# Patient Record
Sex: Male | Born: 1995 | Race: White | Hispanic: No | Marital: Single
Health system: Southern US, Community
[De-identification: ages and names within clinical notes are randomized; demographics above are authoritative.]

## PROBLEM LIST (undated history)

## (undated) DIAGNOSIS — R6250 Unspecified lack of expected normal physiological development in childhood: Secondary | ICD-10-CM

---
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
## 2014-01-14 IMAGING — CT CT ABD-PELV W/ CM
1 of 2 series · 15 of 32 positions shown, 19 images · non-contrast
Comparison: none

REASON FOR EXAM: (1) diffuse abdominal pain with guarding.  r/o appy; (2)
Diffuse abdominal pain
COMMENTS:

[Series 2: 3mm soft tissue · axial · 0.70mm/px · z∈[+443,+911]mm · 15 of 172 slices shown, 19 images]
[im 8/172  soft-tissue]
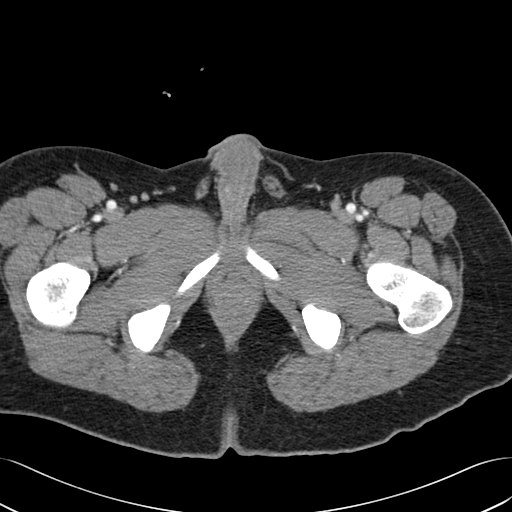
[im 8/172  bone]
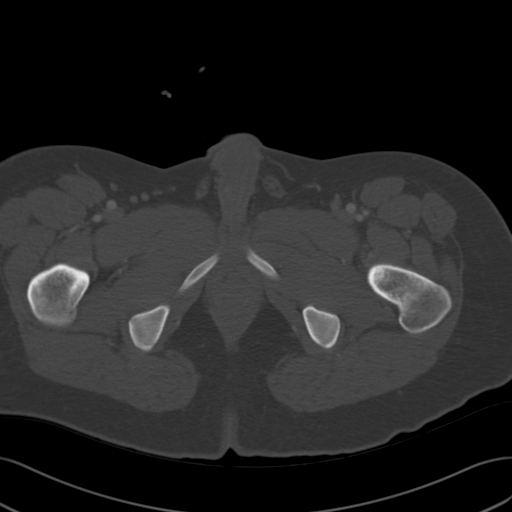
[im 22/172  soft-tissue]
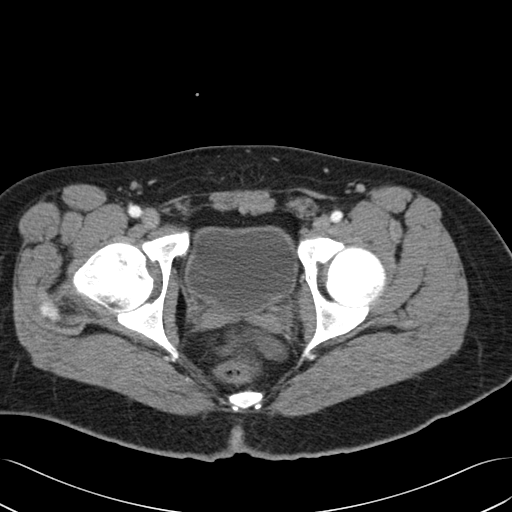
[im 36/172  soft-tissue]
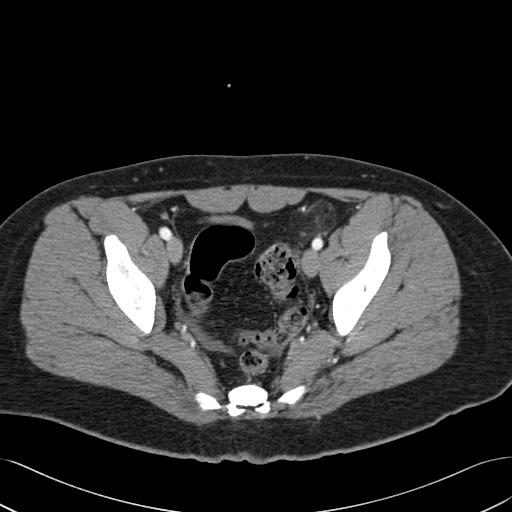
[im 50/172  soft-tissue]
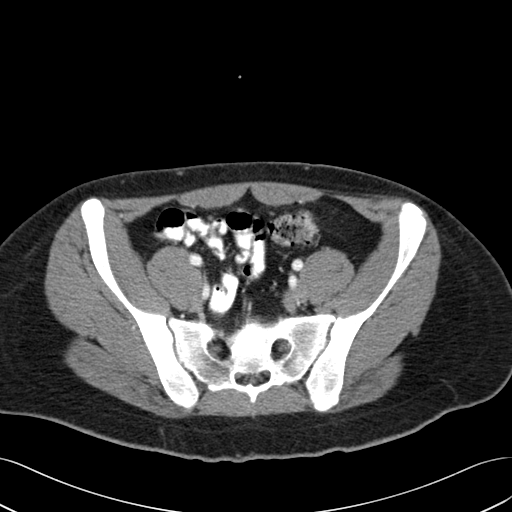
[im 58/172  soft-tissue]
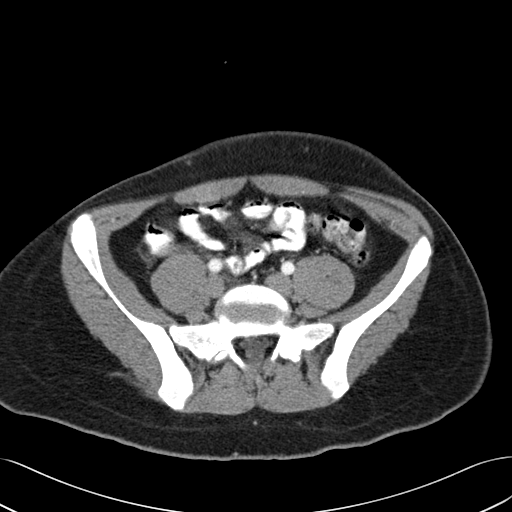
[im 72/172  soft-tissue]
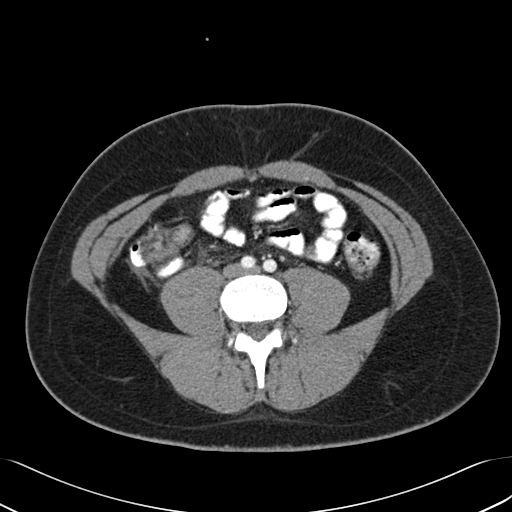
[im 86/172  soft-tissue]
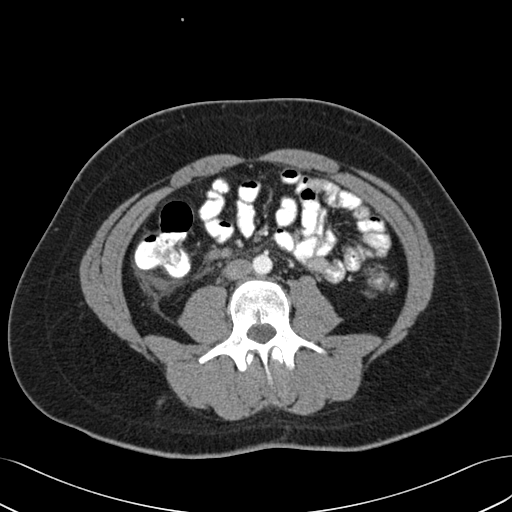
[im 100/172  soft-tissue]
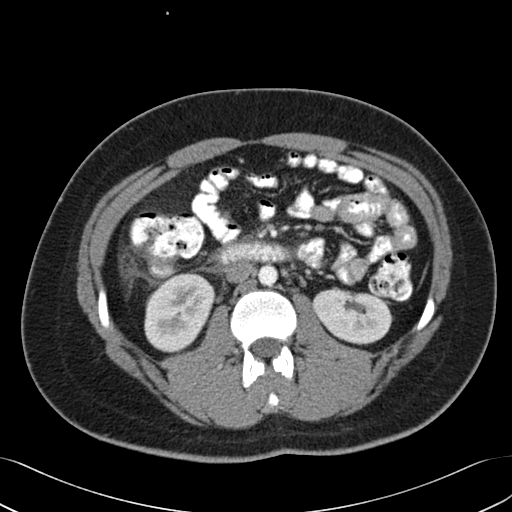
[im 115/172  soft-tissue]
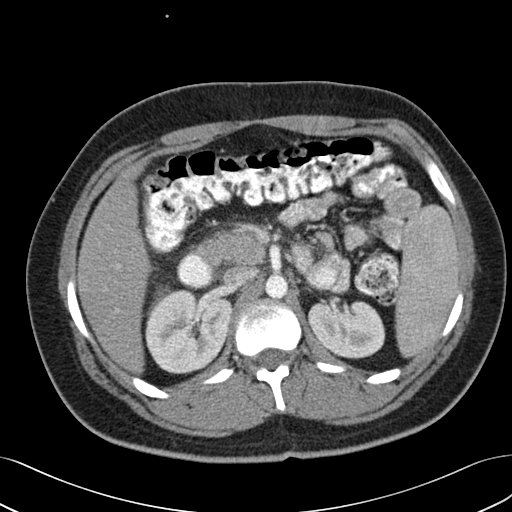
[im 115/172  bone]
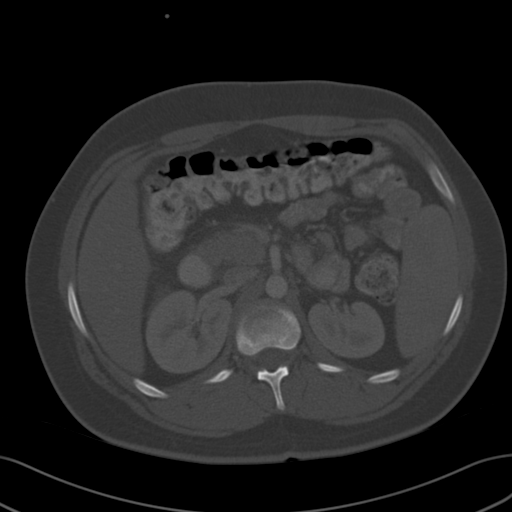
[im 122/172  soft-tissue]
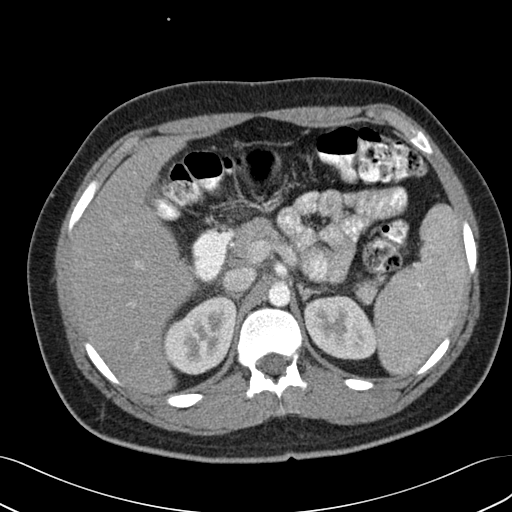
[im 136/172  soft-tissue]
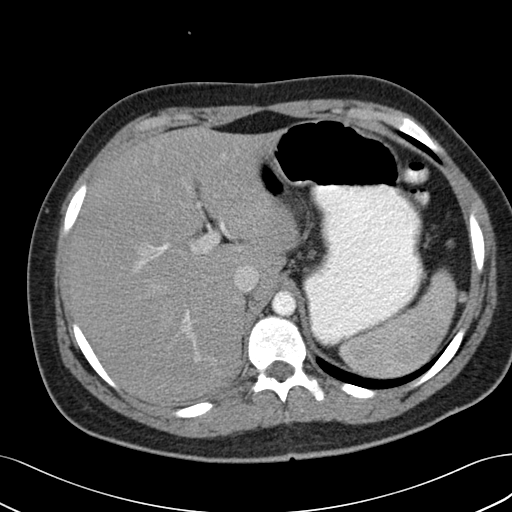
[im 143/172  lung]
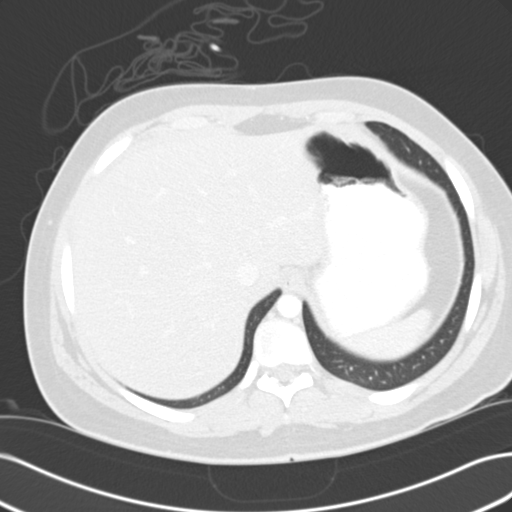
[im 150/172  soft-tissue]
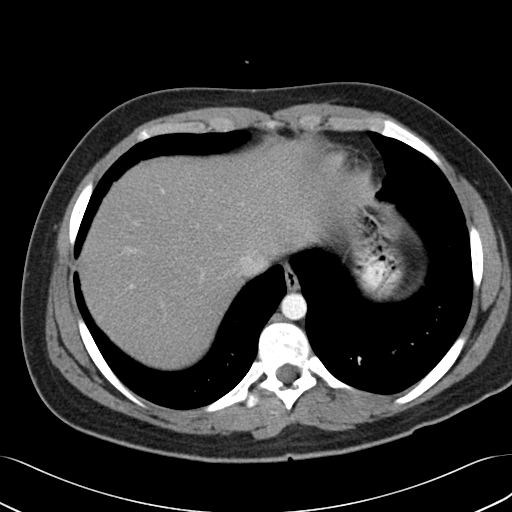
[im 150/172  lung]
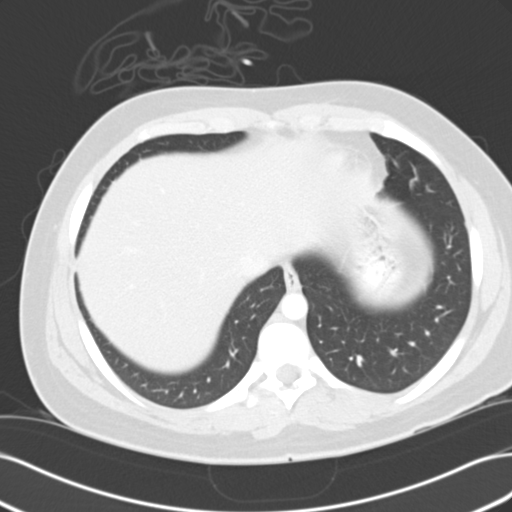
[im 157/172  lung]
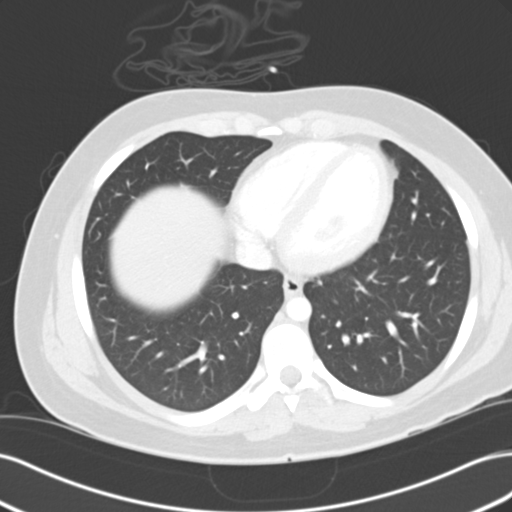
[im 164/172  soft-tissue]
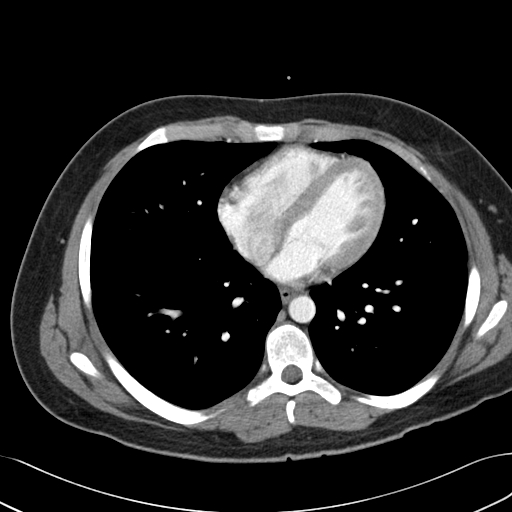
[im 164/172  lung]
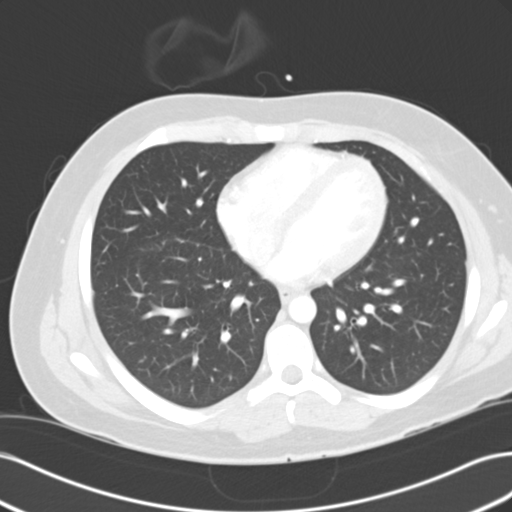

[15 of 32 positions shown; findings below may reference images not displayed]

PROCEDURE:     CT  - CT ABDOMEN / PELVIS  W  - May 03, 2012  [DATE]

RESULT:     CT of the abdomen and pelvis is performed with 100 mL of
Ksovue-9LL iodinated intravenous contrast and oral contrast with images
reconstructed at 3.0 mm slice thickness in the axial plane. The patient has
no previous exam for comparison.

Images through the base the lungs demonstrate grossly normal aeration.

There is an abnormal appearance of the terminal ileum, cecum and appendix.
There is inflammatory stranding in the right lower quadrant region with a
trace amount of fluid present. No free air is evident. Mildly prominent
lymph nodes are seen in the right mid abdominal region medial to the
ascending colon. There is no abscess formation or evidence of perforation.
There is no free fluid in the pelvis. There is a small fat filled left
inguinal hernia. The urinary bladder contains a small amount of fluid. The
remaining portions of the colon and small bowel appear unremarkable.

The liver, spleen, adrenal glands, aorta, gallbladder and kidneys appear
unremarkable. The bony structures appear within normal limits.
IMPRESSION: 1. Abnormal appearance of the appendix, terminal ileum and cecum. The
appendix is distended and has some surrounding inflammation. Differential
considerations are for acute appendicitis without perforation or abscess
formation or possibly inflammation of the terminal ileum and cecum from
enterocolitis with reactive inflammation in the appendix. The latter
scenario is felt to be less likely. Mildly enlarged lymph nodes are noted in
the right lower quadrant and right midabdomen. Surgical consultation is
recommended.

[REDACTED](*)

## 2014-03-30 IMAGING — US ABDOMEN ULTRASOUND LIMITED
1 series · 14 of 25 positions shown · non-contrast
Comparison: none

REASON FOR EXAM: elevated lipase
COMMENTS:   Body Site: GB and Fossa, CBD, Head of Pancreas

PROCEDURE:     US  - US ABDOMEN LIMITED SURVEY  - July 17, 2012  [DATE]
RESULT:     History: Elevated lipase.
Comparison Study: Prior CT of [DATE]

[Series 1: abdomen ultrasound limited · 0.28mm/px · 14 of 44 slices shown]
[im 1/44]
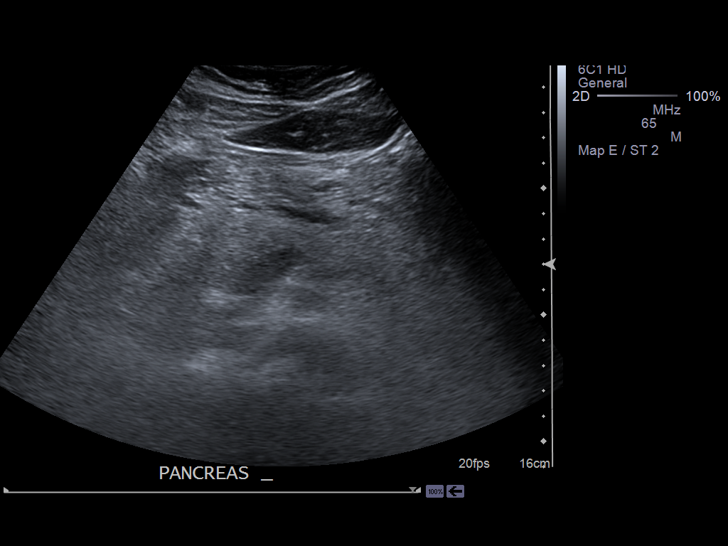
[im 4/44]
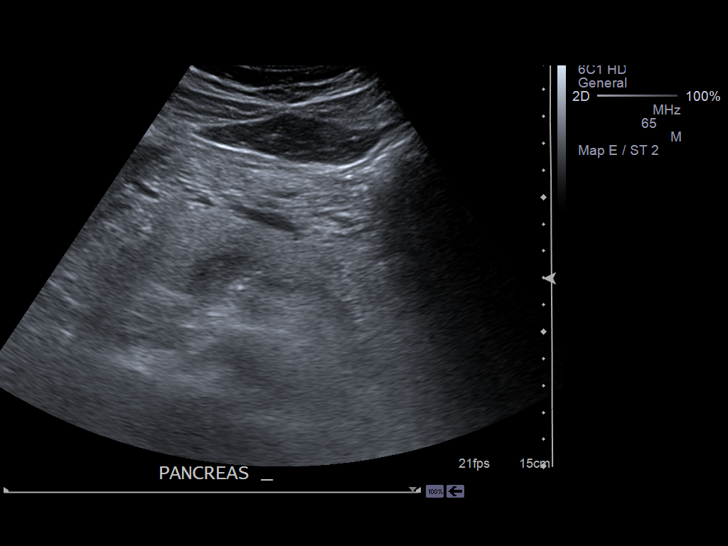
[im 8/44]
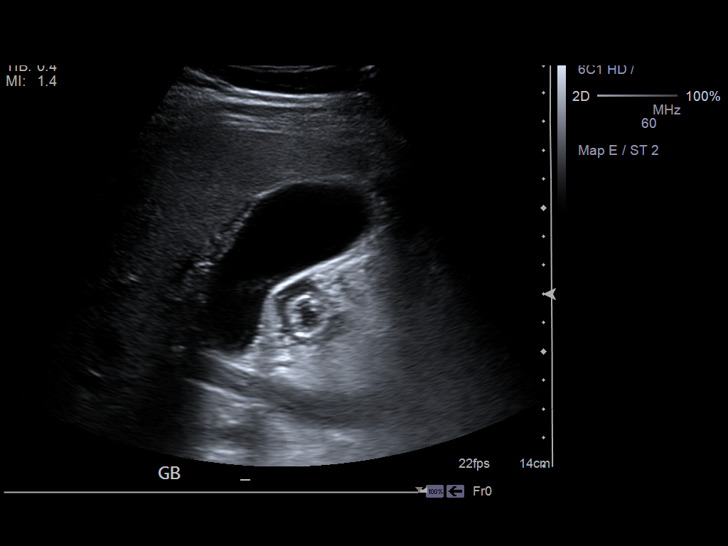
[im 11/44]
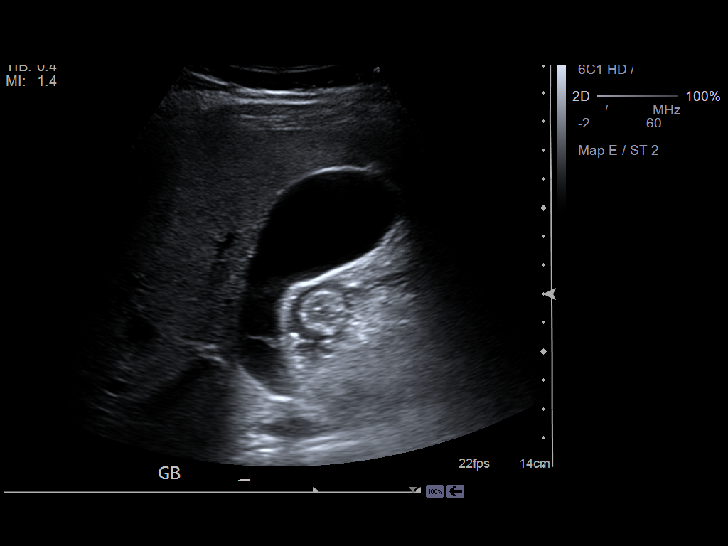
[im 15/44]
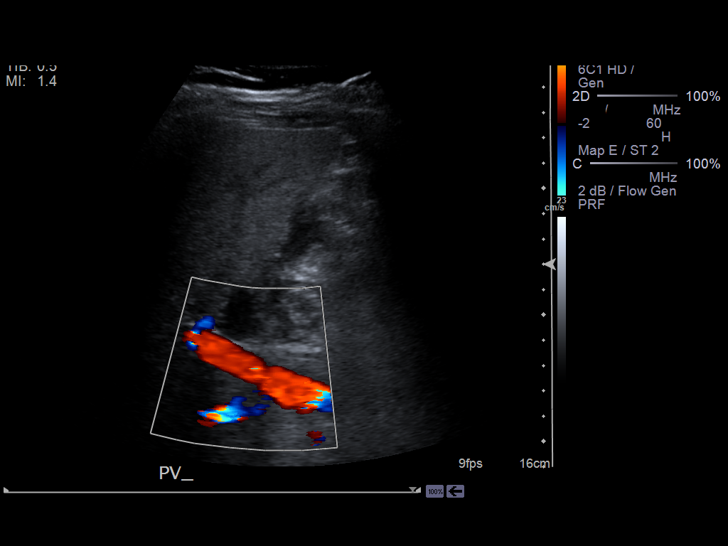
[im 17/44]
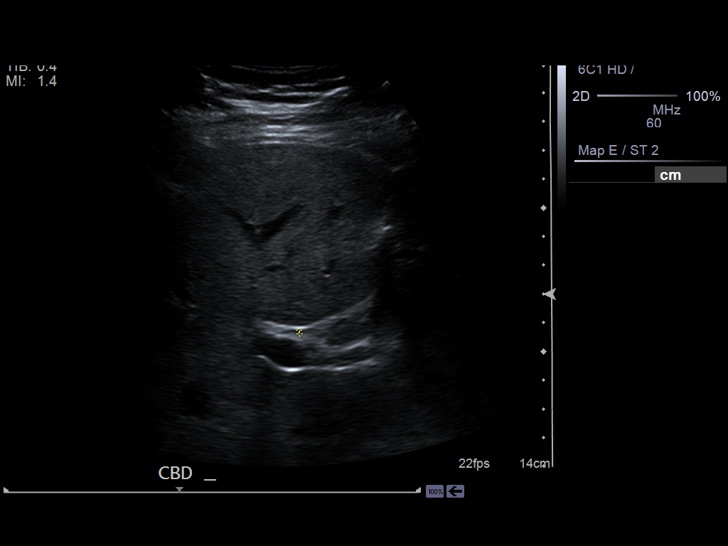
[im 20/44]
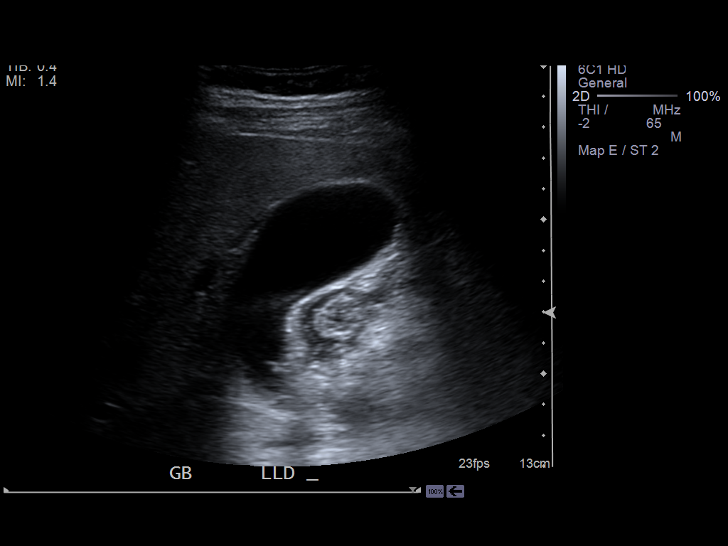
[im 24/44]
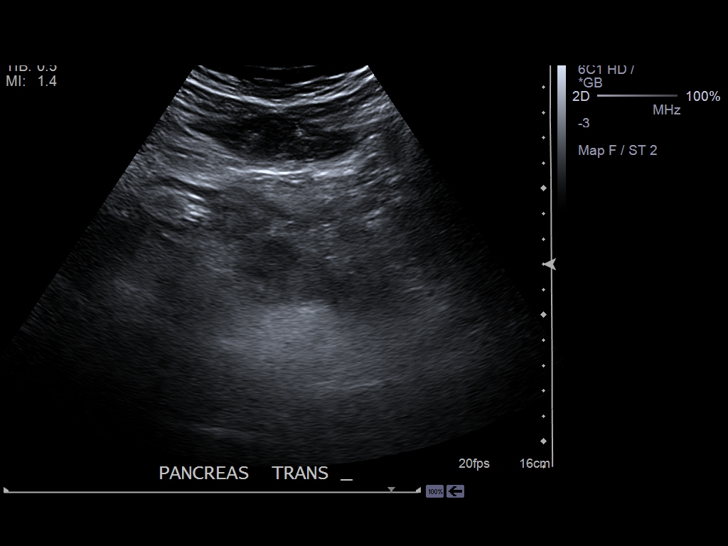
[im 27/44]
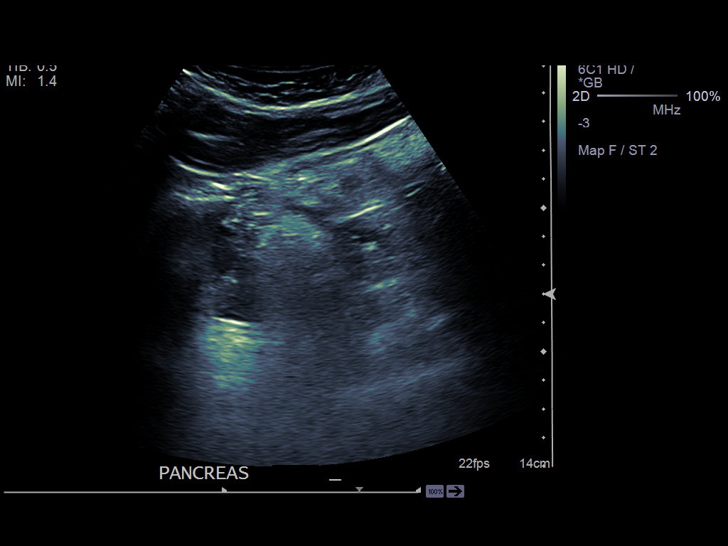
[im 29/44]
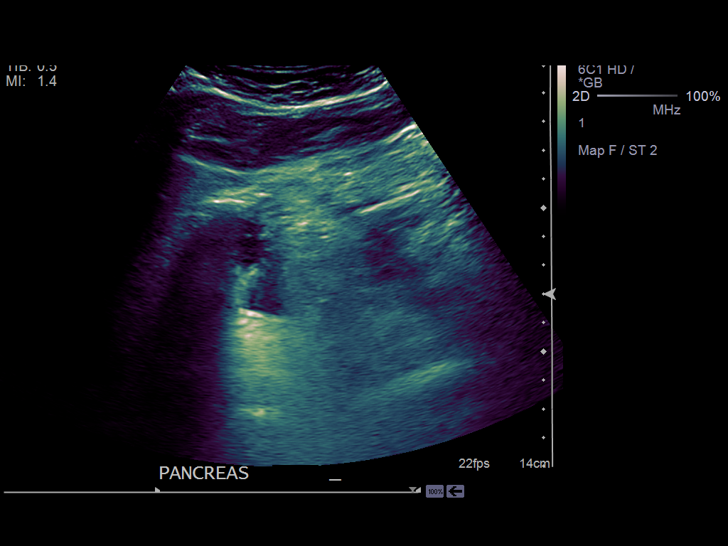
[im 33/44]
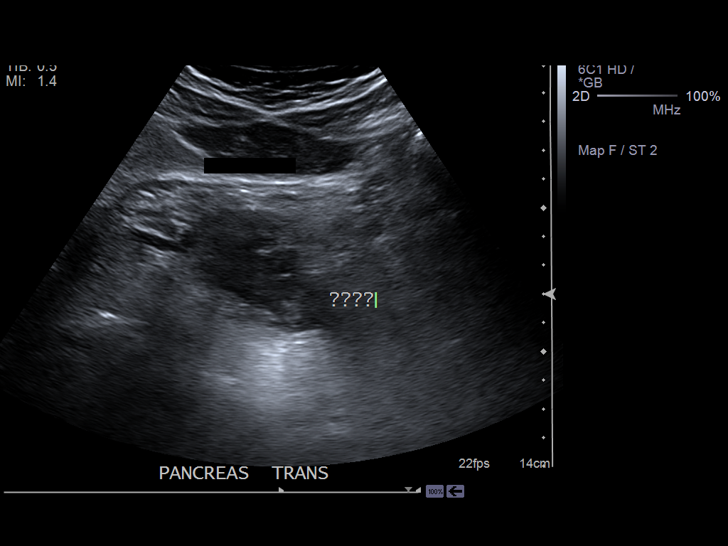
[im 36/44]
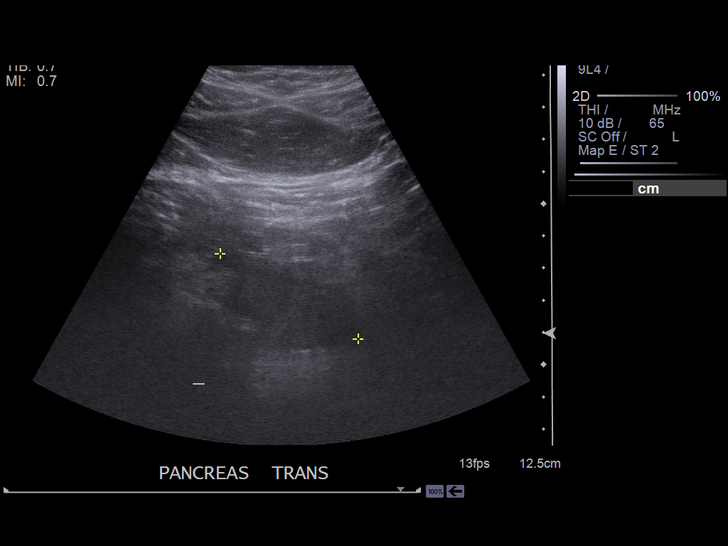
[im 40/44]
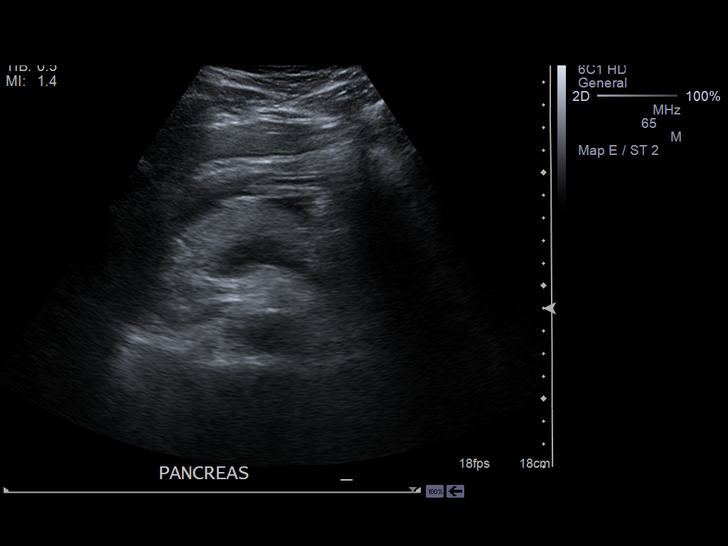
[im 44/44]
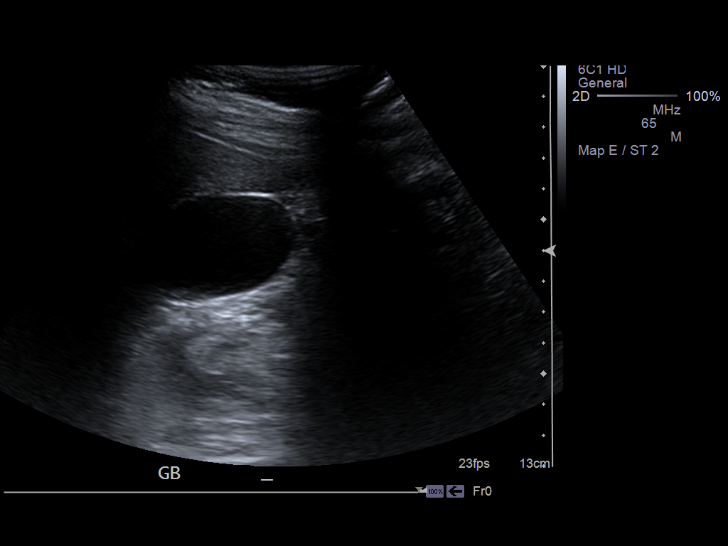

[14 of 25 positions shown; findings below may reference images not displayed]

FINDINGS: Standard ultrasound obtained. Gallbladder is normal. Negative
Murphy's sign. Gallbladder wall thickness 3.0 mm, the common bile duct
diameter 2.5 mm. This could be a phlegmon. This could be a tumor. There is a
complex 3.8 cm mass adjacent pancreatic tail.
IMPRESSION: Complex mass adjacent to the pancreatic tail. This could
represent pancreatic phlegmon or tumor.

## 2015-03-22 NOTE — Op Note (Signed)
PATIENT NAME:  [name], Frederick M MR#:  [id number] DATE OF BIRTH:  1996-06-25  DATE OF PROCEDURE:  05/03/2012  PREOPERATIVE DIAGNOSIS: Right lower quadrant abdominal pain.   POSTOPERATIVE DIAGNOSIS: Acute suppurative appendicitis.   PROCEDURE PERFORMED:  1. Diagnostic laparoscopy.  2. Laparoscopic appendectomy.   SURGEON: Raynald KempMark A Makalyn Lennox, MD   ANESTHESIA: General endotracheal.   FINDINGS:  1. Normal terminal ileum and cecum. 2. Early acute appendicitis.  3. Left inguinal hernia.  SPECIMENS: Appendix to pathology.    ESTIMATED BLOOD LOSS: Minimal.  DESCRIPTION OF PROCEDURE: With the patient in the supine position, general oral endotracheal anesthesia was induced. A Foley catheter was placed. The left arm was padded and tucked at his side. The abdomen was clipped of hair, prepped and draped utilizing ChloraPrep solution. Timeout was observed.   A 12 mm blunt Hassan trocar was placed through an infraumbilical transversely-oriented skin incision with stay sutures being passed through the fascia. Pneumoperitoneum was established. Diagnostic laparoscopy was then performed. In order to accomplish this, a 5 mm Bladeless trocar was placed in the right upper quadrant. A 5 mm Bladeless trocar with balloon occlusion was placed in the suprapubic midline. The small bowel was run and found to be unremarkable. The appendix was acutely inflamed. Photodocumentation was obtained. The liver and gallbladder appeared normal in appearance. There was a left inguinal hernia containing incarcerated omentum. This was undisturbed. The appendix was elevated towards the anterior abdominal wall. Filmy adhesions were taken down with sharp dissection laterally. A window was fashioned with blunt technique at the base of the appendix. The right upper quadrant 5 mm Bladeless trocar was upsized to a 12 mm Bladeless trocar for passage of the stapler. A single blue load of the endoscopic 35 mm stapler was used to achieve transection of  the base of the appendix. The mesoappendix was then likewise taken with a single fire of the white load stapler. Hemostasis was obtained with point cautery and one staple line on the bowel wall. The right lower quadrant was then irrigated with a total of 1 liter of lactated normal saline and aspirated dry. The specimen was captured in an EndoCatch device and retrieved easily. Pneumoperitoneum was established once again, and all fluid was aspirated. Ports were then removed under direct visualization. A total of 20 mL of 0.25% plain Marcaine was infiltrated along all skin and fascial incisions prior to closure. Hemostasis was obtained in subcutaneous tissues with point cautery. A 4-0 Vicryl was used to reapproximate the skin edges, followed by the application of benzoin, Steri-Strips, Telfa, and Tegaderm. The patient was then subsequently extubated and taken to the recovery room in stable and satisfactory condition by Anesthesia services.    ____________________________ Redge GainerMark A. Egbert GaribaldiBird, MD mab:cbb D: 05/03/2012 15:10:54 ET T: 05/03/2012 15:40:40 ET JOB#: [id number]  cc: Loraine LericheMark A. Egbert GaribaldiBird, MD, <Dictator> Raynald KempMARK A Chenoa Luddy MD ELECTRONICALLY SIGNED 05/04/2012 13:36

## 2015-03-22 NOTE — H&P (Signed)
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 accompanied by mother from which history is obtained.  In ER ct scan cscan concerning for acute appendicitis,  wbc normal but lipase found to be elevated, being repeated.    Past History see above   Past Med/Surgical Hx:  scizoaffective:   ODD:   adhd:   bipolar:   ALLERGIES:  Other -Explain in Comment Field: Anaphylaxis  Augmentin: Resp. Distress, Hives  Tape: Hives    Other Allergies none   HOME MEDICATIONS: Medication Status  lithium Active  trazadone Active  geodon Active  adderall Active  klonopin Active  multiple meds.  Active   Family and Social History:   Family History Non-Contributory    Place of Living Home   Review of Systems:   Subjective/Chief Complaint see above    Abdominal Pain Yes  see above.    Nausea/Vomiting No    Tolerating Diet No   Physical Exam:   GEN obese, disheveled    HEENT pale conjunctivae, PERRL    NECK supple    RESP normal resp effort  clear BS    CARD regular rate    ABD positive tenderness  no liver/spleen enlargement  no hernia  pos rovsings and focal rlq peritoneal signs    EXTR negative cyanosis/clubbing    NEURO cranial nerves intact    PSYCH A+O to time, place, person   Lab Results: Hepatic:  06-Jun-13 04:45    Bilirubin, Total 0.4   Alkaline Phosphatase 131   SGPT (ALT)  88 (12-78 NOTE: NEW REFERENCE RANGE 10/21/2011)   SGOT (AST)  90   Total Protein, Serum 7.8   Albumin, Serum 4.0  Routine Chem:  06-Jun-13 04:45    Glucose, Serum  108   BUN 12   Creatinine (comp) 0.66   Sodium, Serum 139   Potassium, Serum 4.1   Chloride, Serum 102   CO2, Serum  27   Calcium (Total), Serum 9.3   Osmolality (calc)  278   Anion Gap 10 (Result(s) reported on 03 May 2012 at 07:45AM.)   Lipase  2347 (Result(s) reported on 03 May 2012 at 07:51AM.)  Routine UA:  06-Jun-13 04:45    Color (UA) Yellow   Clarity (UA) Clear   Glucose (UA) Negative   Bilirubin (UA) Negative   Ketones (UA) Trace   Specific Gravity (UA) 1.020   Blood (UA) Negative   pH (UA) 6.0   Protein (UA) Negative   Nitrite (UA) Negative   Leukocyte Esterase (UA) Negative (Result(s) reported on 03 May 2012 at 08:48AM.)   RBC (UA) 1 /HPF   WBC (UA) 1 /HPF   Bacteria (UA) NONE SEEN   Epithelial Cells (UA) NONE SEEN   Mucous (UA) PRESENT (Result(s) reported on 03 May 2012 at 08:48AM.)  Routine Hem:  06-Jun-13 04:45    WBC (CBC) 7.7   RBC (CBC) 4.85   Hemoglobin (CBC) 15.7   Hematocrit (CBC) 45.7   Platelet Count (CBC) 183 (Result(s) reported on 03 May 2012 at 07:23AM.)   MCV 94   MCH 32.4   MCHC 34.3   RDW 12.6   Radiology Results: CT:    06-Jun-13 08:19, CT Abdomen and Pelvis With Contrast   CT Abdomen and Pelvis With Contrast  REASON FOR EXAM:    (1) diffuse abdominal pain with guarding.  r/o appy;   (2) Diffuse abdominal pain  COMMENTS:       PROCEDURE: CT  - CT ABDOMEN / PELVIS  W  - May 03 2012  8:19AM     RESULT: CT of the abdomen and pelvis is performed with 100 mL of  Isovue-300 iodinated intravenous contrast and oral contrast with images   reconstructed at 3.0 mm slice thickness in the axial plane. The patient   has no previous exam for comparison.    Images through the base the lungs demonstrate grossly normalaeration.    There is an abnormal appearance of the terminal ileum, cecum and   appendix. There is inflammatory stranding in the right lower quadrant     region with a trace amount of fluid present. No free air is evident.   Mildly prominent lymph nodes are seen in the right mid abdominal region   medial to the ascending colon. There is no abscess formation or evidence   of perforation. There is no  free fluid in the pelvis. There is a small   fat filled left inguinal hernia. The urinary bladder contains a small   amount of fluid. The remaining portions of the colon and small bowel   appear unremarkable.    The liver, spleen, adrenal glands, aorta, gallbladder and kidneys appear   unremarkable. The bony structures appear within normal limits.    IMPRESSION:   1. Abnormal appearance of the appendix, terminal ileum and cecum. The   appendix is distended and has some surrounding inflammation. Differential   considerations are for acute appendicitis without perforation or abscess     formationor possibly inflammation of the terminal ileum and cecum from   enterocolitis with reactive inflammation in the appendix. The latter   scenario is felt to be less likely. Mildly enlarged lymph nodes are noted   in the right lower quadrant and right midabdomen. Surgical consultation   is recommended.    Dictation Site: 1(*)          Verified By: Elveria RoyalsGEOFFREY H. BROWNE, M.D., MD     Assessment/Admission Diagnosis 19 y/o male with clinical exam and hx consistent with acute appendicitis. elevated lipase not consistent with clinical hx and ct scan scan findings.    Plan npo, invanz, repeat lipase. diagnostic laparoscopy, appendectomy.   Electronic Signatures: Natale LayBird, Keaira Whitehurst (MD)  (Signed 06-Jun-13 10:38)  Authored: CHIEF COMPLAINT and HISTORY, PAST MEDICAL/SURGIAL HISTORY, ALLERGIES, Other Allergies, HOME MEDICATIONS, FAMILY AND SOCIAL HISTORY, REVIEW OF SYSTEMS, PHYSICAL EXAM, LABS, Radiology, ASSESSMENT AND PLAN   Last Updated: 06-Jun-13 10:38 by Natale LayBird, Amilah Greenspan (MD)

## 2015-04-25 ENCOUNTER — Encounter: Payer: Self-pay | Admitting: Emergency Medicine

## 2015-04-25 ENCOUNTER — Emergency Department
Admission: EM | Admit: 2015-04-25 | Discharge: 2015-04-25 | Disposition: A | Discharge status: Home / Self Care | Payer: Medicare Other | Attending: Emergency Medicine | Admitting: Emergency Medicine

## 2015-04-25 DIAGNOSIS — J029 Acute pharyngitis, unspecified: Secondary | ICD-10-CM | POA: Diagnosis present

## 2015-04-25 DIAGNOSIS — B9789 Other viral agents as the cause of diseases classified elsewhere: Secondary | ICD-10-CM

## 2015-04-25 DIAGNOSIS — J40 Bronchitis, not specified as acute or chronic: Secondary | ICD-10-CM | POA: Diagnosis not present

## 2015-04-25 DIAGNOSIS — H73892 Other specified disorders of tympanic membrane, left ear: Secondary | ICD-10-CM | POA: Insufficient documentation

## 2015-04-25 DIAGNOSIS — J988 Other specified respiratory disorders: Secondary | ICD-10-CM

## 2015-04-25 DIAGNOSIS — J069 Acute upper respiratory infection, unspecified: Secondary | ICD-10-CM | POA: Insufficient documentation

## 2015-04-25 HISTORY — DX: Unspecified lack of expected normal physiological development in childhood: R62.50

## 2015-04-25 MED ORDER — ALBUTEROL SULFATE HFA 108 (90 BASE) MCG/ACT IN AERS: 2.0000 | INHALATION_SPRAY | Freq: Four times a day (QID) | RESPIRATORY_TRACT | Status: DC | PRN

## 2015-04-25 MED ORDER — IPRATROPIUM-ALBUTEROL 0.5-2.5 (3) MG/3ML IN SOLN
RESPIRATORY_TRACT | Status: AC
  Filled 2015-04-25: qty 3

## 2015-04-25 MED ORDER — IPRATROPIUM-ALBUTEROL 0.5-2.5 (3) MG/3ML IN SOLN
3.0000 mL | Freq: Once | RESPIRATORY_TRACT | Status: AC
  Administered 2015-04-25: 3 mL via RESPIRATORY_TRACT

## 2015-04-25 MED ORDER — PREDNISONE 20 MG PO TABS: 40.0000 mg | ORAL_TABLET | Freq: Every day | ORAL | Status: DC

## 2015-04-25 NOTE — ED Provider Notes (Signed)
CSN: [id number]     Arrival date & time 04/25/15  1540 History   First MD Initiated Contact with Patient 04/25/15 1651     Chief Complaint  Patient presents with  . Sore Throat    sore throat and earaches since yesterday     (Consider location/radiation/quality/duration/timing/severity/associated sxs/prior Treatment) HPI  19 year old male presents with a two-week history of cough, congestion. One week ago he had significant sinus pressure, was treated with Tobi Bastos biotic incompleted antibody. He did improve up until the last 2-3 days he has developed ear pain, pressure, coughing. Patient has had a mild sore throat. He was treated with over-the-counter cold medication, this did relieve his symptoms significantly. Currently patient is not having any ear pain, sore throat, chest pain, shortness of breath. He has not had any fevers. He is tolerating by mouth well.   Past Medical History  Diagnosis Date  . Developmental delay    No past surgical history on file. No family history on file. History  Substance Use Topics  . Smoking status: Not on file  . Smokeless tobacco: Not on file  . Alcohol Use: Not on file    Review of Systems  Constitutional: Negative.  Negative for fever, chills, activity change and appetite change.  HENT: Positive for congestion, ear pain, rhinorrhea and sore throat. Negative for mouth sores, sinus pressure and trouble swallowing.   Eyes: Negative for photophobia, pain and discharge.  Respiratory: Negative for cough, chest tightness and shortness of breath.   Cardiovascular: Negative for chest pain and leg swelling.  Gastrointestinal: Negative for nausea, vomiting, abdominal pain, diarrhea and abdominal distention.  Genitourinary: Negative for dysuria and difficulty urinating.  Musculoskeletal: Negative for back pain, arthralgias and gait problem.  Skin: Negative for color change and rash.  Neurological: Negative for dizziness and headaches.  Hematological:  Negative for adenopathy.  Psychiatric/Behavioral: Negative for behavioral problems and agitation.      Allergies  Augmentin  Home Medications   Prior to Admission medications   Medication Sig Start Date End Date Taking? Authorizing Provider  albuterol (PROVENTIL HFA;VENTOLIN HFA) 108 (90 BASE) MCG/ACT inhaler Inhale 2 puffs into the lungs every 6 (six) hours as needed for wheezing or shortness of breath. 04/25/15   Evon Slack, PA-C  predniSONE (DELTASONE) 20 MG tablet Take 2 tablets (40 mg total) by mouth daily. 04/25/15   Evon Slack, PA-C   BP 145/80 mmHg  Pulse 96  Temp(Src) 98.2 F (36.8 C) (Oral)  Resp 18  Ht  (1.753 m)  Wt 250 lb (113.399 kg)  BMI 36.90 kg/m2  SpO2 97% Physical Exam  Constitutional: He is oriented to person, place, and time. He appears well-developed and well-nourished.  HENT:  Head: Normocephalic and atraumatic.  Right Ear: Hearing, tympanic membrane and ear canal normal. No drainage or tenderness. No foreign bodies. No mastoid tenderness. Tympanic membrane is not bulging.  Left Ear: Hearing, external ear and ear canal normal. No drainage or tenderness. No foreign bodies. No mastoid tenderness. Tympanic membrane is bulging.  Nose: No rhinorrhea.  Mouth/Throat: No oral lesions. No trismus in the jaw. Normal dentition. No uvula swelling. No oropharyngeal exudate, posterior oropharyngeal edema, posterior oropharyngeal erythema or tonsillar abscesses.  Eyes: Conjunctivae and EOM are normal. Pupils are equal, round, and reactive to light.  Neck: Normal range of motion. Neck supple.  Cardiovascular: Normal rate, regular rhythm, normal heart sounds and intact distal pulses.   Pulmonary/Chest: Effort normal. No respiratory distress. He has wheezes (slight expiratory).  He has no rales. He exhibits no tenderness.  Abdominal: Soft. Bowel sounds are normal.  Musculoskeletal: Normal range of motion. He exhibits no edema or tenderness.  Lymphadenopathy:     He has no cervical adenopathy.  Neurological: He is alert and oriented to person, place, and time. No cranial nerve deficit.  Skin: Skin is warm and dry.  Psychiatric: He has a normal mood and affect. His behavior is normal. Judgment and thought content normal.    ED Course  Procedures (including critical care time) Patient was given a DuoNeb treatment 1. This did improve his wheezing. Patient had no wheezing at discharge. Labs Review Labs Reviewed - No data to display Point care rapid strep test negative for strep, sent for culture  Imaging Review No results found.   EKG Interpretation None      MDM   Final diagnoses:  Viral respiratory illness  Bronchitis    Patient was given a prescription for prednisone 40 mg 1 tab by mouth daily 5 days. He was also given a prescription for albuterol inhaler. Continue with over-the-counter cough and cold medicine. Follow-up if no relief in 5-7 days. Return to the ER for any fevers or difficulty swallowing shortness of breath.    Evon Slackhomas C Gaines, PA-C 04/25/15 1739  Sharman CheekPhillip Stafford, MD 04/25/15 [phone]

## 2015-04-25 NOTE — Discharge Instructions (Signed)
Upper Respiratory Infection, Adult An upper respiratory infection (URI) is also sometimes known as the common cold. The upper respiratory tract includes the nose, sinuses, throat, trachea, and bronchi. Bronchi are the airways leading to the lungs. Most people improve within 1 week, but symptoms can last up to 2 weeks. A residual cough may last even longer.  CAUSES Many different viruses can infect the tissues lining the upper respiratory tract. The tissues become irritated and inflamed and often become very moist. Mucus production is also common. A cold is contagious. You can easily spread the virus to others by oral contact. This includes kissing, sharing a glass, coughing, or sneezing. Touching your mouth or nose and then touching a surface, which is then touched by another person, can also spread the virus. SYMPTOMS  Symptoms typically develop 1 to 3 days after you come in contact with a cold virus. Symptoms vary from person to person. They may include: 1. Runny nose. 2. Sneezing. 3. Nasal congestion. 4. Sinus irritation. 5. Sore throat. 6. Loss of voice (laryngitis). 7. Cough. 8. Fatigue. 9. Muscle aches. 10. Loss of appetite. 11. Headache. 12. Low-grade fever. DIAGNOSIS  You might diagnose your own cold based on familiar symptoms, since most people get a cold 2 to 3 times a year. Your caregiver can confirm this based on your exam. Most importantly, your caregiver can check that your symptoms are not due to another disease such as strep throat, sinusitis, pneumonia, asthma, or epiglottitis. Blood tests, throat tests, and X-rays are not necessary to diagnose a common cold, but they may sometimes be helpful in excluding other more serious diseases. Your caregiver will decide if any further tests are required. RISKS AND COMPLICATIONS  You may be at risk for a more severe case of the common cold if you smoke cigarettes, have chronic heart disease (such as heart failure) or lung disease (such as  asthma), or if you have a weakened immune system. The very young and very old are also at risk for more serious infections. Bacterial sinusitis, middle ear infections, and bacterial pneumonia can complicate the common cold. The common cold can worsen asthma and chronic obstructive pulmonary disease (COPD). Sometimes, these complications can require emergency medical care and may be life-threatening. PREVENTION  The best way to protect against getting a cold is to practice good hygiene. Avoid oral or hand contact with people with cold symptoms. Wash your hands often if contact occurs. There is no clear evidence that vitamin C, vitamin E, echinacea, or exercise reduces the chance of developing a cold. However, it is always recommended to get plenty of rest and practice good nutrition. TREATMENT  Treatment is directed at relieving symptoms. There is no cure. Antibiotics are not effective, because the infection is caused by a virus, not by bacteria. Treatment may include:  Increased fluid intake. Sports drinks offer valuable electrolytes, sugars, and fluids.  Breathing heated mist or steam (vaporizer or shower).  Eating chicken soup or other clear broths, and maintaining good nutrition.  Getting plenty of rest.  Using gargles or lozenges for comfort.  Controlling fevers with ibuprofen or acetaminophen as directed by your caregiver.  Increasing usage of your inhaler if you have asthma. Zinc gel and zinc lozenges, taken in the first 24 hours of the common cold, can shorten the duration and lessen the severity of symptoms. Pain medicines may help with fever, muscle aches, and throat pain. A variety of non-prescription medicines are available to treat congestion and runny nose. Your caregiver  can make recommendations and may suggest nasal or lung inhalers for other symptoms.  HOME CARE INSTRUCTIONS   Only take over-the-counter or prescription medicines for pain, discomfort, or fever as directed by your  caregiver.  Use a warm mist humidifier or inhale steam from a shower to increase air moisture. This may keep secretions moist and make it easier to breathe.  Drink enough water and fluids to keep your urine clear or pale yellow.  Rest as needed.  Return to work when your temperature has returned to normal or as your caregiver advises. You may need to stay home longer to avoid infecting others. You can also use a face mask and careful hand washing to prevent spread of the virus. SEEK MEDICAL CARE IF:   After the first few days, you feel you are getting worse rather than better.  You need your caregiver's advice about medicines to control symptoms.  You develop chills, worsening shortness of breath, or brown or red sputum. These may be signs of pneumonia.  You develop yellow or brown nasal discharge or pain in the face, especially when you bend forward. These may be signs of sinusitis.  You develop a fever, swollen neck glands, pain with swallowing, or white areas in the back of your throat. These may be signs of strep throat. SEEK IMMEDIATE MEDICAL CARE IF:   You have a fever.  You develop severe or persistent headache, ear pain, sinus pain, or chest pain.  You develop wheezing, a prolonged cough, cough up blood, or have a change in your usual mucus (if you have chronic lung disease).  You develop sore muscles or a stiff neck. Document Released: 05/10/2001 Document Revised: 02/06/2012 Document Reviewed: 02/19/2014 [facility] Patient Information 2015 [place], [place]. This information is not intended to replace advice given to you by your health care provider. Make sure you discuss any questions you have with your health care provider.  Viral Infections A viral infection can be caused by different types of viruses.Most viral infections are not serious and resolve on their own. However, some infections may cause severe symptoms and may lead to further complications. SYMPTOMS Viruses can  frequently cause: 13. Minor sore throat. 14. Aches and pains. 15. Headaches. 16. Runny nose. 17. Different types of rashes. 18. Watery eyes. 19. Tiredness. 20. Cough. 21. Loss of appetite. 22. Gastrointestinal infections, resulting in nausea, vomiting, and diarrhea. These symptoms do not respond to antibiotics because the infection is not caused by bacteria. However, you might catch a bacterial infection following the viral infection. This is sometimes called a "superinfection." Symptoms of such a bacterial infection may include:  Worsening sore throat with pus and difficulty swallowing.  Swollen neck glands.  Chills and a high or persistent fever.  Severe headache.  Tenderness over the sinuses.  Persistent overall ill feeling (malaise), muscle aches, and tiredness (fatigue).  Persistent cough.  Yellow, green, or brown mucus production with coughing. HOME CARE INSTRUCTIONS   Only take over-the-counter or prescription medicines for pain, discomfort, diarrhea, or fever as directed by your caregiver.  Drink enough water and fluids to keep your urine clear or pale yellow. Sports drinks can provide valuable electrolytes, sugars, and hydration.  Get plenty of rest and maintain proper nutrition. Soups and broths with crackers or rice are fine. SEEK IMMEDIATE MEDICAL CARE IF:   You have severe headaches, shortness of breath, chest pain, neck pain, or an unusual rash.  You have uncontrolled vomiting, diarrhea, or you are unable to keep down fluids.  You or your child has an oral temperature above 102 F (38.9 C), not controlled by medicine.  Your baby is older than 3 months with a rectal temperature of 102 F (38.9 C) or higher.  Your baby is 61 months old or younger with a rectal temperature of 100.4 F (38 C) or higher. MAKE SURE YOU:   Understand these instructions.  Will watch your condition.  Will get help right away if you are not doing well or get worse. Document  Released: 08/24/2005 Document Revised: 02/06/2012 Document Reviewed: 03/21/2011 [facility] Patient Information 2015 [place], [place]. This information is not intended to replace advice given to you by your health care provider. Make sure you discuss any questions you have with your health care provider.  Metered Dose Inhaler with Spacer Inhaled medicines are the basis of treatment of asthma and other breathing problems. Inhaled medicine can only be effective if used properly. Good technique assures that the medicine reaches the lungs. Your health care provider has asked you to use a spacer with your inhaler to help you take the medicine more effectively. A spacer is a plastic tube with a mouthpiece on one end and an opening that connects to the inhaler on the other end. Metered dose inhalers (MDIs) are used to deliver a variety of inhaled medicines. These include quick relief or rescue medicines (such as bronchodilators) and controller medicines (such as corticosteroids). The medicine is delivered by pushing down on a metal canister to release a set amount of spray. If you are using different kinds of inhalers, use your quick relief medicine to open the airways 10-15 minutes before using a steroid if instructed to do so by your health care provider. If you are unsure which inhalers to use and the order of using them, ask your health care provider, nurse, or respiratory therapist. HOW TO USE THE INHALER WITH A SPACER 23. Remove cap from inhaler. 24. If you are using the inhaler for the first time, you will need to prime it. Shake the inhaler for 5 seconds and release four puffs into the air, away from your face. Ask your health care provider or pharmacist if you have questions about priming your inhaler. 25. Shake inhaler for 5 seconds before each breath in (inhalation). 26. Place the open end of the spacer onto the mouthpiece of the inhaler. 27. Position the inhaler so that the top of the canister faces up  and the spacer mouthpiece faces you. 28. Put your index finger on the top of the medicine canister. Your thumb supports the bottom of the inhaler and the spacer. 29. Breathe out (exhale) normally and as completely as possible. 30. Immediately after exhaling, place the spacer between your teeth and into your mouth. Close your mouth tightly around the spacer. 31. Press the canister down with the index finger to release the medicine. 32. At the same time as the canister is pressed, inhale deeply and slowly until the lungs are completely filled. This should take 4-6 seconds. Keep your tongue down and out of the way. 33. Hold the medicine in your lungs for 5-10 seconds (10 seconds is best). This helps the medicine get into the small airways of your lungs. Exhale. 34. Repeat inhaling deeply through the spacer mouthpiece. Again hold that breath for up to 10 seconds (10 seconds is best). Exhale slowly. If it is difficult to take this second deep breath through the spacer, breathe normally several times through the spacer. Remove the spacer from your mouth. 35.  Wait at least 15-30 seconds between puffs. Continue with the above steps until you have taken the number of puffs your health care provider has ordered. Do not use the inhaler more than your health care provider directs you to. 36. Remove spacer from the inhaler and place cap on inhaler. 37. Follow the directions from your health care provider or the inhaler insert for cleaning the inhaler and spacer. If you are using a steroid inhaler, rinse your mouth with water after your last puff, gargle, and spit out the water. Do not swallow the water. AVOID:  Inhaling before or after starting the spray of medicine. It takes practice to coordinate your breathing with triggering the spray.  Inhaling through the nose (rather than the mouth) when triggering the spray. HOW TO DETERMINE IF YOUR INHALER IS FULL OR NEARLY EMPTY You cannot know when an inhaler is  empty by shaking it. A few inhalers are now being made with dose counters. Ask your health care provider for a prescription that has a dose counter if you feel you need that extra help. If your inhaler does not have a counter, ask your health care provider to help you determine the date you need to refill your inhaler. Write the refill date on a calendar or your inhaler canister. Refill your inhaler 7-10 days before it runs out. Be sure to keep an adequate supply of medicine. This includes making sure it is not expired, and you have a spare inhaler.  SEEK MEDICAL CARE IF:   Symptoms are only partially relieved with your inhaler.  You are having trouble using your inhaler.  You experience some increase in phlegm. SEEK IMMEDIATE MEDICAL CARE IF:   You feel little or no relief with your inhalers. You are still wheezing and are feeling shortness of breath or tightness in your chest or both.  You have dizziness, headaches, or fast heart rate.  You have chills, fever, or night sweats.  There is a noticeable increase in phlegm production, or there is blood in the phlegm. Document Released: 11/14/2005 Document Revised: 03/31/2014 Document Reviewed: 05/02/2013 [facility] Patient Information 2015 [place], [place]. This information is not intended to replace advice given to you by your health care provider. Make sure you discuss any questions you have with your health care provider.

## 2015-04-28 LAB — CULTURE, GROUP A STREP (THRC)

## 2023-11-25 ENCOUNTER — Other Ambulatory Visit: Payer: Self-pay

## 2023-11-25 ENCOUNTER — Emergency Department
Admission: EM | Admit: 2023-11-25 | Discharge: 2023-11-26 | Disposition: A | Discharge status: Home / Self Care | Payer: 59 | Attending: Emergency Medicine | Admitting: Emergency Medicine

## 2023-11-25 DIAGNOSIS — F88 Other disorders of psychological development: Secondary | ICD-10-CM | POA: Insufficient documentation

## 2023-11-25 DIAGNOSIS — R6250 Unspecified lack of expected normal physiological development in childhood: Secondary | ICD-10-CM | POA: Diagnosis present

## 2023-11-25 DIAGNOSIS — F319 Bipolar disorder, unspecified: Secondary | ICD-10-CM | POA: Diagnosis present

## 2023-11-25 DIAGNOSIS — R451 Restlessness and agitation: Secondary | ICD-10-CM | POA: Diagnosis not present

## 2023-11-25 DIAGNOSIS — Z79899 Other long term (current) drug therapy: Secondary | ICD-10-CM | POA: Diagnosis not present

## 2023-11-25 LAB — CBC
HCT: 46.3 % (ref 39.0–52.0)
Hemoglobin: 16.3 g/dL (ref 13.0–17.0)
MCH: 30.7 pg (ref 26.0–34.0)
MCHC: 35.2 g/dL (ref 30.0–36.0)
MCV: 87.2 fL (ref 80.0–100.0)
Platelets: 240 10*3/uL (ref 150–400)
RBC: 5.31 MIL/uL (ref 4.22–5.81)
RDW: 12.5 % (ref 11.5–15.5)
WBC: 7.8 10*3/uL (ref 4.0–10.5)
nRBC: 0 % (ref 0.0–0.2)

## 2023-11-25 LAB — COMPREHENSIVE METABOLIC PANEL
ALT: 39 U/L (ref 0–44)
AST: 34 U/L (ref 15–41)
Albumin: 4.1 g/dL (ref 3.5–5.0)
Alkaline Phosphatase: 116 U/L (ref 38–126)
Anion gap: 11 (ref 5–15)
BUN: 16 mg/dL (ref 6–20)
CO2: 28 mmol/L (ref 22–32)
Calcium: 9 mg/dL (ref 8.9–10.3)
Chloride: 101 mmol/L (ref 98–111)
Creatinine, Ser: 0.99 mg/dL (ref 0.61–1.24)
GFR, Estimated: >60 mL/min (ref >60)
Glucose, Bld: 96 mg/dL (ref 70–99)
Potassium: 4.1 mmol/L (ref 3.5–5.1)
Sodium: 140 mmol/L (ref 135–145)
Total Bilirubin: 0.5 mg/dL (ref <1.2)
Total Protein: 7.1 g/dL (ref 6.5–8.1)

## 2023-11-25 LAB — SALICYLATE LEVEL: Salicylate Lvl: <7 mg/dL — ABNORMAL LOW (ref 7.0–30.0)

## 2023-11-25 LAB — ACETAMINOPHEN LEVEL: Acetaminophen (Tylenol), Serum: <10 ug/mL — ABNORMAL LOW (ref 10–30)

## 2023-11-25 LAB — ETHANOL: Alcohol, Ethyl (B): <10 mg/dL (ref <10)

## 2023-11-25 MED ORDER — ALUM & MAG HYDROXIDE-SIMETH 200-200-20 MG/5ML PO SUSP: 30.0000 mL | Freq: Four times a day (QID) | ORAL | Status: DC | PRN

## 2023-11-25 MED ORDER — IBUPROFEN 600 MG PO TABS: 600.0000 mg | ORAL_TABLET | Freq: Three times a day (TID) | ORAL | Status: DC | PRN

## 2023-11-25 MED ORDER — ONDANSETRON HCL 4 MG PO TABS: 4.0000 mg | ORAL_TABLET | Freq: Three times a day (TID) | ORAL | Status: DC | PRN

## 2023-11-25 NOTE — BH Assessment (Signed)
Comprehensive Clinical Assessment (CCA) Screening, Triage and Referral Note  11/25/2023 Frederick Lin [id number]  Chief Complaint: Patient is a 27 year old male presenting to [facility] ED voluntarily. Per triage note Pt is VOL, pt states he had an argument with his mom tonight and wanted to come to the ER because " he lost his mind" pt reports he took his prescribed medication and has since calmed down. Pt states "I'm just here to stay the night" pt denies SI/HI. During assessment patient appears alert and oriented x4, calm and cooperative. Patient reports "I had a meltdown, I had a nervous breakdown, it started with an argument with my mom." Patient reports having "mood swings, so I take a blue pill to help with that", he reports that he has a outpatient psychiatrist, Dr. Ave Filter and takes his medications as prescribed. Patient reports feeling better since being in the ED and only wants to "stay the night." Patient denies SI/HI/AH/VH. Chief Complaint  Patient presents with   Psychiatric Evaluation   Visit Diagnosis: Bipolar 1 Disorder, Schizophrenia per history  Patient Reported Information How did you hear about [place]? Self  What Is the Reason for Your Visit/Call Today? Pt is VOL, pt states he had an argument with his mom tonight and wanted to come to the ER because " he lost his mind" pt reports he took his prescribed medication and has since calmed down. Pt states "I'm just here to stay the night" pt denies SI/HI.  How Long Has This Been Causing You Problems? > than 6 months  What Do You Feel Would Help You the Most Today? No data recorded  Have You Recently Had Any Thoughts About Hurting Yourself? No  Are You Planning to Commit Suicide/Harm Yourself At This time? No   Have you Recently Had Thoughts About Hurting Someone [name]? No  Are You Planning to Harm Someone at This Time? No  Explanation: No data recorded  Have You Used Any Alcohol or Drugs in the Past 24 Hours? No data  recorded How Long Ago Did You Use Drugs or Alcohol? No data recorded What Did You Use and How Much? No data recorded  Do You Currently Have a Therapist/Psychiatrist? No data recorded Name of Therapist/Psychiatrist: No data recorded  Have You Been Recently Discharged From Any Office Practice or Programs? No  Explanation of Discharge From Practice/Program: No data recorded   CCA Screening Triage Referral Assessment Type of Contact: Face-to-Face  Telemedicine Service Delivery:   Is this Initial or Reassessment?   Date Telepsych consult ordered in [facility]:    Time Telepsych consult ordered in [facility]:    Location of Assessment: [facility] ED  Provider Location: [facility] ED    Collateral Involvement: No data recorded  Does Patient Have a Court Appointed Legal Guardian? No data recorded Name and Contact of Legal Guardian: No data recorded If Minor and Not Living with Parent(s), Who has Custody? No data recorded Is CPS involved or ever been involved? Never  Is APS involved or ever been involved? Never   Patient Determined To Be At Risk for Harm To Self or Others Based on Review of Patient Reported Information or Presenting Complaint? No  Method: No data recorded Availability of Means: No data recorded Intent: No data recorded Notification Required: No data recorded Additional Information for Danger to Others Potential: No data recorded Additional Comments for Danger to Others Potential: No data recorded Are There Guns or Other Weapons in Your Home? No  Types of Guns/Weapons: No data  recorded Are These Weapons Safely Secured?                            No data recorded Who Could Verify You Are Able To Have These Secured: No data recorded Do You Have any Outstanding Charges, Pending Court Dates, Parole/Probation? No data recorded Contacted To Inform of Risk of Harm To Self or Others: No data recorded  Does Patient Present under Involuntary Commitment? No    [place] of Residence:  [place]   Patient Currently Receiving the Following Services: Medication Management   Determination of Need: Emergent (2 hours)   Options For Referral: No data recorded  Disposition Recommendation per psychiatric provider: Dispo pending  [name] A Dijon Cosens, LCAS-A

## 2023-11-25 NOTE — ED Provider Notes (Signed)
[facility] Provider Note    None    (approximate)   History   Chief Complaint: Psychiatric Evaluation   HPI  Frederick Lin is a 27 y.o. male with a history of bipolar disorder and developmental delay who is brought to the ED due to becoming agitated at home.  Denies SI HI or hallucinations.  States that he is feeling better now after taking his medication but would like to stay overnight to continue to cooled off.  Denies self-injurious behavior or ingestions     Physical Exam   Triage Vital Signs: ED Triage Vitals  Encounter Vitals Group     BP 11/25/23 2109 (!) 151/85     Systolic BP Percentile --      Diastolic BP Percentile --      Pulse Rate 11/25/23 2109 (!) 114     Resp 11/25/23 2109 18     Temp 11/25/23 2109 97.7 F (36.5 C)     Temp Source 11/25/23 2109 Oral     SpO2 11/25/23 2109 95 %     Weight 11/25/23 2107 230 lb (104.3 kg)     Height 11/25/23 2107 6' (1.829 m)     Head Circumference --      Peak Flow --      Pain Score 11/25/23 2107 0     Pain Loc --      Pain Education --      Exclude from Growth Chart --     Most recent vital signs: Vitals:   11/25/23 2109  BP: (!) 151/85  Pulse: (!) 114  Resp: 18  Temp: 97.7 F (36.5 C)  SpO2: 95%    General: Awake, no distress. CV:  Good peripheral perfusion.  Resp:  Normal effort.  Abd:  No distention.  Other:  No wounds   ED Results / Procedures / Treatments   Labs (all labs ordered are listed, but only abnormal results are displayed) Labs Reviewed  SALICYLATE LEVEL - Abnormal; Notable for the following components:      Result Value   Salicylate Lvl <7.0 (*)    All other components within normal limits  ACETAMINOPHEN LEVEL - Abnormal; Notable for the following components:   Acetaminophen (Tylenol), Serum <10 (*)    All other components within normal limits  COMPREHENSIVE METABOLIC PANEL  ETHANOL  CBC  URINE DRUG SCREEN, QUALITATIVE ([facility] ONLY)      EKG    RADIOLOGY    PROCEDURES:  Procedures   MEDICATIONS ORDERED IN ED: Medications  ibuprofen (ADVIL) tablet 600 mg (has no administration in time range)  ondansetron (ZOFRAN) tablet 4 mg (has no administration in time range)  alum & mag hydroxide-simeth (MAALOX/MYLANTA) 200-200-20 MG/5ML suspension 30 mL (has no administration in time range)     IMPRESSION / MDM / ASSESSMENT AND PLAN / ED COURSE  I reviewed the triage vital signs and the nursing notes.  Patient's presentation is most consistent with acute presentation with potential threat to life or bodily function.  Patient brought to the ED after episode of agitation.  Has significant psychiatric history.  Will consult psychiatry.  The patient has been placed in psychiatric observation due to the need to provide a safe environment for the patient while obtaining psychiatric consultation and evaluation, as well as ongoing medical and medication management to treat the patient's condition.  The patient has not been placed under full IVC at this time.      FINAL CLINICAL IMPRESSION(S) / ED  DIAGNOSES   Final diagnoses:  Agitation     Rx / DC Orders   ED Discharge Orders     None        Note:  This document was prepared using Dragon voice recognition software and may include unintentional dictation errors.   Sharman Cheek, MD 11/25/23 [phone]

## 2023-11-25 NOTE — ED Notes (Signed)
Patient reports he got upset with his mom and that he is here because he needs a break from her.  Patient states he takes medications as prescribed.  Denies SI or HI at this time.

## 2023-11-25 NOTE — ED Triage Notes (Addendum)
Pt is Frederick Lin, pt states he had an argument with his mom tonight and wanted to come to the ER because " he lost his mind" pt reports he took his prescribed medication and has since calmed down. Pt states "I'm just here to stay the night" pt denies SI/HI.

## 2023-11-25 NOTE — ED Notes (Signed)
Pt belongings:  Blue jacket Red pants White shirt Black shoes

## 2023-11-25 NOTE — ED Notes (Signed)
TTS in to speak with patient.

## 2023-11-26 ENCOUNTER — Encounter: Payer: Self-pay | Admitting: Psychiatry

## 2023-11-26 DIAGNOSIS — R6250 Unspecified lack of expected normal physiological development in childhood: Secondary | ICD-10-CM | POA: Diagnosis present

## 2023-11-26 DIAGNOSIS — F319 Bipolar disorder, unspecified: Secondary | ICD-10-CM | POA: Diagnosis present

## 2023-11-26 NOTE — ED Notes (Signed)
Called number listed under contacts for parent to pick up patient, no answer HIPAA compliant message left.

## 2023-11-26 NOTE — ED Notes (Signed)
Pt ambulated to the restroom and back to room, with this tech following behind for safety. Pt tolerated ambulation well. No other concerns at this time.

## 2023-11-26 NOTE — Consult Note (Signed)
[facility] Consult Note  Patient Name: Frederick Lin MRN: [id number] DOB: 1996/07/26 DATE OF Consult: 11/26/2023  PRIMARY PSYCHIATRIC DIAGNOSES  1.  Bipolar I disorder 2.  Developmental Delay   RECOMMENDATIONS  Inpt psych admission recommended:    [] YES       [x]  NO  Patient may discharge home   If yes:       []   Pt meets involuntary commitment criteria if not voluntary       []    Pt does not meet involuntary commitment criteria and must be         voluntary. If patient is not voluntary, then discharge is recommended.   Medication recommendations:  continue with his outpatient medications of olanzapine, trazodone, and clonazepam Non-Medication recommendations:  we discussed coping skills to implement when feeling upset with mom instead of coming to ER: patient verbalized he could walk away from situation and counting to 10; he also said he could take his medication and go to his room to play his video games which he enjoys    Follow-Up Telepsychiatry C/L services:            []  We will continue to follow this patient with you.             [x]  Will sign off for now. Please re-consult our service as necessary.  Thank you for involving [place] in the care of this patient. If you have any additional questions or concerns, please call [phone] and ask for me or the provider on-call.  TELEPSYCHIATRY ATTESTATION & CONSENT  As the provider for this telehealth consult, I attest that I verified the patients identity using two separate identifiers, introduced myself to the patient, provided my credentials, disclosed my location, and performed this encounter via a HIPAA-compliant, real-time, face-to-face, two-way, interactive audio and video platform and with the full consent and agreement of the patient (or guardian as applicable.)  Patient physical location: ED at [facility] Telehealth provider physical location: home office in state of [place]  Video start time: 00:00 (Central Time) Video  end time: 00:15 (Central Time)  IDENTIFYING DATA  Frederick Lin is a 27 y.o. year-old male for whom a psychiatric consultation has been ordered by the primary provider. The patient was identified using two separate identifiers.  CHIEF COMPLAINT/REASON FOR CONSULT  "I had a nervous breakdown"  HISTORY OF PRESENT ILLNESS (HPI)  The patient reports calling police when he became upset with his mother regarding the TV.  Stated police brought him to ED "thought I was going to have a nervous breakdown, didn't know they were going to bring me here".    The patient takes olanzapine, trazodone, clonazepam for Bipolar Disorder., Developmental Delay  Patient reports medication compliance.  Reports last saw psychiatrist "few days ago", follow ups every 3 months.   Patient is poor historian. Has low frustration tolerance.  Today, client denied symptoms of depression with anergia, anhedonia, amotivation, no anxiety or frequent worry, denied feeling restlessness, no reported panic symptoms, no reported obsessive/compulsive behaviors. Client denies active SI/HI ideations, plans or intent. There is no evidence of psychosis or delusional thinking.  Client denied past episodes of hypomania, hyperactivity, erratic/excessive spending, involvement in dangerous activities, self-inflated ego, grandiosity, or promiscuity.  sleeping 12 hrs/24hrs, appetite "well" concentration "good" has no somatic concerns   Patient reports he feels better now that he has taken his medication and spent some time away from home.  He is agreeable for discharge back to home.  We discussed coping strategies for future use.    Reviewed active outpatient medication list/reviewed labs.    PAST PSYCHIATRIC HISTORY   Previous Psychiatric Hospitalizations: once "few years ago" Previous Detox/Residential treatments: denied Outpt treatment:  outpatient psychiatrist, Dr. Ave Filter  Previous psychotropic medication trials: "I'm not sure" Previous  mental health diagnosis per client/MEDICAL RECORD NUMBERDevelopmental delay, Bipolar 1 Disorder, Schizophrenia per history, ADHD  Suicide attempts/self-injurious behaviors:  denied history of suicidal/homicidal ideation/gestures   PAST MEDICAL HISTORY  Past Medical History:  Diagnosis Date   Developmental delay      HOME MEDICATIONS  clonazePAM (KLONOPIN) 1 MG tablet  Take 1 tablet (1 mg total) by mouth 2 (two) times daily     Active traZODone (DESYREL) 100 MG tablet  Take 4 tablets (400 mg total) by mouth at bedtime     Active OLANZapine (ZYPREXA) 10 MG tablet  Take 1 tablet (10 mg total) by mouth Take 1 tab every morning and 2 tabs at bed time     Active   ALLERGIES  Allergies  Allergen Reactions   Augmentin [Amoxicillin-Pot Clavulanate] Rash    SOCIAL & SUBSTANCE USE HISTORY  Client was raised by parents; currently lives with parents  single; no children:             obtains SSDI:      Education: HS grad Denied current legal issues.   Social Drivers of Health Y/N   Physicist, medical Strain: N  Food Insecurity: N  Transportation Needs: N  Physical Activity: N  Stress: Y  Social Connections: N  Intimate Partner Violence: N  Housing Stability: N          Denied drug or alcohol use.    UDS not noted in EMR BAL <10     FAMILY HISTORY   Family Psychiatric History (if known):  denied psychiatric illnesses, substance abuse or suicides  MENTAL STATUS EXAM (MSE)  Mental Status Exam: General Appearance: Well Groomed  Orientation:  Full (Time, Place, and Person)  Memory:  Immediate;   Good Recent;   Fair Remote;   Fair  Concentration:  Concentration: Good  Recall:  Fair  Attention  Good  Eye Contact:  Good  Speech:  Clear and Coherent  Language:  Good  Volume:  Normal  Mood: "good"  Affect:  Constricted  Thought Process:  Goal Directed  Thought Content:   Concrete  Suicidal Thoughts:  No  Homicidal Thoughts:  No  Judgement:  Impaired  Insight:  Lacking   Psychomotor Activity:  Normal  Akathisia:  No  Fund of Knowledge:  Fair    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 rate 18, height 6' (1.829 m), weight 104.3 kg, SpO2 95%.  LABS  Admission on 11/25/2023  Component Date Value Ref Range Status   Sodium 11/25/2023 140  135 - 145 mmol/L Final   Potassium 11/25/2023 4.1  3.5 - 5.1 mmol/L Final   Chloride 11/25/2023 101  98 - 111 mmol/L Final   CO2 11/25/2023 28  22 - 32 mmol/L Final   Glucose, Bld 11/25/2023 96  70 - 99 mg/dL Final   Glucose reference range applies only to samples taken after fasting for at least 8 hours.   BUN 11/25/2023  16  6 - 20 mg/dL Final   Creatinine, Ser 11/25/2023 0.99  0.61 - 1.24 mg/dL Final   Calcium 61/60/7371 9.0  8.9 - 10.3 mg/dL Final   Total Protein 05/23/9484 7.1  6.5 - 8.1 g/dL Final   Albumin 46/27/0350 4.1  3.5 - 5.0 g/dL Final   AST 09/38/1829 34  15 - 41 U/L Final   ALT 11/25/2023 39  0 - 44 U/L Final   Alkaline Phosphatase 11/25/2023 116  38 - 126 U/L Final   Total Bilirubin 11/25/2023 0.5  <1.2 mg/dL Final   GFR, Estimated 11/25/2023 >60  >60 mL/min Final   Comment: (NOTE) Calculated using the CKD-EPI Creatinine Equation (2021)    Anion gap 11/25/2023 11  5 - 15 Final   Performed at [facility], [address] [place], [place] [address]   Alcohol, Ethyl (B) 11/25/2023 <10  <10 mg/dL Final   Comment: (NOTE) Lowest detectable limit for serum alcohol is 10 mg/dL.  For medical purposes only. Performed at [facility], [address] [place], [place] [address]    Salicylate Lvl 11/25/2023 <7.0 (L)  7.0 - 30.0 mg/dL Final   Performed at [facility], [address] [place], [place] [address]    Acetaminophen (Tylenol), Serum 11/25/2023 <10 (L)  10 - 30 ug/mL Final   Comment: (NOTE) Therapeutic concentrations vary significantly. A range of 10-30 ug/mL  may be an effective concentration for many patients. However, some  are best treated at concentrations outside of this range. Acetaminophen concentrations >150 ug/mL at 4 hours after ingestion  and >50 ug/mL at 12 hours after ingestion are often associated with  toxic reactions.  Performed at [facility], [address] [place], [place] [address]    WBC 11/25/2023 7.8  4.0 - 10.5 K/uL Final   RBC 11/25/2023 5.31  4.22 - 5.81 MIL/uL Final   Hemoglobin 11/25/2023 16.3  13.0 - 17.0 g/dL Final   HCT 58/52/7782 46.3  39.0 - 52.0 % Final   MCV 11/25/2023 87.2  80.0 - 100.0 fL Final   MCH 11/25/2023 30.7  26.0 - 34.0 pg Final   MCHC 11/25/2023 35.2  30.0 - 36.0 g/dL Final   RDW 42/35/3614 12.5  11.5 - 15.5 % Final   Platelets 11/25/2023 240  150 - 400 K/uL Final   nRBC 11/25/2023 0.0  0.0 - 0.2 % Final   Performed at [facility], [address] [place], [place] [address]    PSYCHIATRIC REVIEW OF SYSTEMS (ROS)         Musculoskeletal: No abnormal movements observed      Gait & Station: Laying/Sitting      Pain Screening: Denies        RISK FORMULATION/ASSESSMENT  Is the patient experiencing any suicidal or homicidal ideations: No       Explain if yes:  Protective factors considered for safety management:  Absence of psychosis Access to adequate health care Advice& help seeking Resourcefulness/Survival skills Life Satisfaction Positive social support: Positive therapeutic relationship Future oriented Suicide Inquiry:  Denies suicidal ideations, intentions, or plans.  Denies  recent self-harm behavior. Talks futuristically.  Risk factors/concerns considered for safety management:  Access to lethal means Impulsivity Male gender Unmarried  Is there a safety management plan with the patient and  treatment team to minimize risk factors and promote protective factors: Yes           Explain: close observation pending eval Is crisis care placement or psychiatric  hospitalization recommended: No     Based on my current evaluation and risk assessment, patient is determined at this time to be at:  Low risk  *RISK ASSESSMENT Risk assessment is a dynamic process; it is possible that this patient's condition, and risk level, may change. This should be re-evaluated and managed over time as appropriate. Please re-consult psychiatric consult services if additional assistance is needed in terms of risk assessment and management. If your team decides to discharge this patient, please advise the patient how to best access emergency psychiatric services, or to call 911, if their condition worsens or they feel unsafe in any way.  Total time spent in this encounter was 40 minutes with greater than 50% of time spent in counseling and coordination of care.     Dr. Olivia Mackie. Christell Constant, PhD, MSN, APRN, PMHNP-BC, [facility], NP Telepsychiatry Consult Services

## 2023-11-26 NOTE — ED Notes (Signed)
Spoke with patient's father, who will come pick up patient.

## 2023-11-26 NOTE — ED Provider Notes (Signed)
Patient seen by psychiatry who recommends outpatient management.  Suitable for [facility] follow-up.   Delton Prairie, MD 11/26/23 [phone]

## 2023-11-26 NOTE — ED Notes (Signed)
This RN called number provided by patient as father, Michael's cell phone [phone]).  No answer, HIPAA compliant message left.
